# Patient Record
Sex: Female | Born: 2000 | Race: Black or African American | Hispanic: No | Marital: Single | Smoking: Never smoker
Health system: Southern US, Community
[De-identification: ages and names within clinical notes are randomized; demographics above are authoritative.]

## PROBLEM LIST (undated history)

## (undated) DIAGNOSIS — F840 Autistic disorder: Secondary | ICD-10-CM

---

## 2019-02-09 ENCOUNTER — Emergency Department (HOSPITAL_COMMUNITY)
Admission: EM | Admit: 2019-02-09 | Discharge: 2019-03-29 | Disposition: A | Discharge status: Other Acute Inpatient Hospital | Payer: No Typology Code available for payment source | Attending: Emergency Medicine | Admitting: Emergency Medicine

## 2019-02-09 ENCOUNTER — Other Ambulatory Visit: Payer: Self-pay

## 2019-02-09 ENCOUNTER — Encounter (HOSPITAL_COMMUNITY): Payer: Self-pay | Admitting: Emergency Medicine

## 2019-02-09 DIAGNOSIS — F840 Autistic disorder: Secondary | ICD-10-CM

## 2019-02-09 DIAGNOSIS — R479 Unspecified speech disturbances: Secondary | ICD-10-CM | POA: Diagnosis not present

## 2019-02-09 DIAGNOSIS — Z79899 Other long term (current) drug therapy: Secondary | ICD-10-CM | POA: Insufficient documentation

## 2019-02-09 DIAGNOSIS — R4689 Other symptoms and signs involving appearance and behavior: Secondary | ICD-10-CM

## 2019-02-09 DIAGNOSIS — Z20828 Contact with and (suspected) exposure to other viral communicable diseases: Secondary | ICD-10-CM | POA: Diagnosis not present

## 2019-02-09 DIAGNOSIS — R456 Violent behavior: Secondary | ICD-10-CM | POA: Diagnosis not present

## 2019-02-09 HISTORY — DX: Autistic disorder: F84.0

## 2019-02-09 LAB — CBC WITH DIFFERENTIAL/PLATELET
Abs Immature Granulocytes: 0.03 10*3/uL (ref 0.00–0.07)
Basophils Absolute: 0 10*3/uL (ref 0.0–0.1)
Basophils Relative: 0 %
Eosinophils Absolute: 0 10*3/uL (ref 0.0–0.5)
Eosinophils Relative: 0 %
HCT: 39.2 % (ref 36.0–46.0)
Hemoglobin: 13.1 g/dL (ref 12.0–15.0)
Immature Granulocytes: 0 %
Lymphocytes Relative: 18 %
Lymphs Abs: 1.6 10*3/uL (ref 0.7–4.0)
MCH: 31.3 pg (ref 26.0–34.0)
MCHC: 33.4 g/dL (ref 30.0–36.0)
MCV: 93.8 fL (ref 80.0–100.0)
Monocytes Absolute: 0.5 10*3/uL (ref 0.1–1.0)
Monocytes Relative: 6 %
Neutro Abs: 6.9 10*3/uL (ref 1.7–7.7)
Neutrophils Relative %: 76 %
Platelets: 287 10*3/uL (ref 150–400)
RBC: 4.18 MIL/uL (ref 3.87–5.11)
RDW: 12.1 % (ref 11.5–15.5)
WBC: 9.1 10*3/uL (ref 4.0–10.5)
nRBC: 0 % (ref 0.0–0.2)

## 2019-02-09 LAB — COMPREHENSIVE METABOLIC PANEL
ALT: 25 U/L (ref 0–44)
AST: 47 U/L — ABNORMAL HIGH (ref 15–41)
Albumin: 3.9 g/dL (ref 3.5–5.0)
Alkaline Phosphatase: 79 U/L (ref 38–126)
Anion gap: 8 (ref 5–15)
BUN: 5 mg/dL — ABNORMAL LOW (ref 6–20)
CO2: 22 mmol/L (ref 22–32)
Calcium: 9 mg/dL (ref 8.9–10.3)
Chloride: 105 mmol/L (ref 98–111)
Creatinine, Ser: 0.62 mg/dL (ref 0.44–1.00)
GFR calc Af Amer: >60 mL/min (ref >60)
GFR calc non Af Amer: >60 mL/min (ref >60)
Glucose, Bld: 110 mg/dL — ABNORMAL HIGH (ref 70–99)
Potassium: 3.6 mmol/L (ref 3.5–5.1)
Sodium: 135 mmol/L (ref 135–145)
Total Bilirubin: 0.5 mg/dL (ref 0.3–1.2)
Total Protein: 7.5 g/dL (ref 6.5–8.1)

## 2019-02-09 LAB — ACETAMINOPHEN LEVEL: Acetaminophen (Tylenol), Serum: <10 ug/mL — ABNORMAL LOW (ref 10–30)

## 2019-02-09 LAB — SALICYLATE LEVEL: Salicylate Lvl: <7 mg/dL (ref 2.8–30.0)

## 2019-02-09 MED ORDER — LORAZEPAM 1 MG PO TABS
1.0000 mg | ORAL_TABLET | Freq: Four times a day (QID) | ORAL | Status: DC
  Administered 2019-02-09 – 2019-02-11 (×4): 1 mg via ORAL
  Filled 2019-02-09 (×4): qty 1

## 2019-02-09 MED ORDER — ARIPIPRAZOLE 5 MG PO TABS
15.0000 mg | ORAL_TABLET | Freq: Every day | ORAL | Status: DC
  Administered 2019-02-09: 15 mg via ORAL
  Filled 2019-02-09: qty 3

## 2019-02-09 MED ORDER — HALOPERIDOL 5 MG PO TABS
5.0000 mg | ORAL_TABLET | ORAL | Status: DC | PRN
  Administered 2019-02-10 – 2019-03-07 (×9): 5 mg via ORAL
  Filled 2019-02-09 (×9): qty 1

## 2019-02-09 MED ORDER — BUSPIRONE HCL 5 MG PO TABS
10.0000 mg | ORAL_TABLET | Freq: Two times a day (BID) | ORAL | Status: DC
  Administered 2019-02-09 – 2019-03-29 (×96): 10 mg via ORAL
  Filled 2019-02-09 (×94): qty 2

## 2019-02-09 MED ORDER — TRIPLE ANTIBIOTIC 3.5-400-5000 EX OINT: 1.0000 "application " | TOPICAL_OINTMENT | Freq: Every day | CUTANEOUS | Status: DC | PRN

## 2019-02-09 MED ORDER — ACETAMINOPHEN 325 MG PO TABS
650.0000 mg | ORAL_TABLET | ORAL | Status: DC | PRN
  Administered 2019-03-20: 14:00:00 650 mg via ORAL
  Filled 2019-02-09: qty 2

## 2019-02-09 MED ORDER — CARBAMAZEPINE 200 MG PO TABS
600.0000 mg | ORAL_TABLET | Freq: Two times a day (BID) | ORAL | Status: DC
  Administered 2019-02-09 – 2019-03-29 (×95): 600 mg via ORAL
  Filled 2019-02-09 (×94): qty 3

## 2019-02-09 MED ORDER — DOCUSATE SODIUM 100 MG PO CAPS: 100.0000 mg | ORAL_CAPSULE | Freq: Two times a day (BID) | ORAL | Status: DC | PRN

## 2019-02-09 MED ORDER — PRAZOSIN HCL 1 MG PO CAPS
2.0000 mg | ORAL_CAPSULE | Freq: Every day | ORAL | Status: DC
  Administered 2019-02-10 – 2019-03-28 (×46): 2 mg via ORAL
  Filled 2019-02-09 (×42): qty 2
  Filled 2019-02-09: qty 1
  Filled 2019-02-09 (×6): qty 2

## 2019-02-09 MED ORDER — ALUM & MAG HYDROXIDE-SIMETH 200-200-20 MG/5ML PO SUSP: 20.0000 mL | Freq: Four times a day (QID) | ORAL | Status: DC | PRN

## 2019-02-09 MED ORDER — CLONIDINE HCL 0.1 MG PO TABS
0.1000 mg | ORAL_TABLET | Freq: Two times a day (BID) | ORAL | Status: DC
  Administered 2019-02-09 – 2019-03-29 (×96): 0.1 mg via ORAL
  Filled 2019-02-09 (×95): qty 1

## 2019-02-09 MED ORDER — POLYETHYLENE GLYCOL 3350 17 G PO PACK: 17.0000 g | PACK | Freq: Every day | ORAL | Status: DC | PRN

## 2019-02-09 MED ORDER — LEVONORGEST-ETH ESTRAD 91-DAY 0.15-0.03 &0.01 MG PO TABS: 1.0000 | ORAL_TABLET | Freq: Every morning | ORAL | Status: DC

## 2019-02-09 MED ORDER — MELATONIN 3 MG PO TABS
6.0000 mg | ORAL_TABLET | Freq: Every day | ORAL | Status: DC
  Administered 2019-02-09 – 2019-03-02 (×22): 6 mg via ORAL
  Administered 2019-03-03 (×2): 3 mg via ORAL
  Administered 2019-03-04 – 2019-03-28 (×25): 6 mg via ORAL
  Filled 2019-02-09 (×48): qty 2

## 2019-02-09 MED ORDER — LINACLOTIDE 145 MCG PO CAPS
145.0000 ug | ORAL_CAPSULE | Freq: Every morning | ORAL | Status: DC
  Administered 2019-02-10 – 2019-03-28 (×45): 145 ug via ORAL
  Filled 2019-02-09 (×53): qty 1

## 2019-02-09 MED ORDER — LORAZEPAM 2 MG/ML IJ SOLN
2.0000 mg | Freq: Once | INTRAMUSCULAR | Status: AC
  Administered 2019-02-09: 2 mg via INTRAVENOUS
  Filled 2019-02-09: qty 1

## 2019-02-09 NOTE — ED Notes (Signed)
Soft restraints placed on patient's ankles.

## 2019-02-09 NOTE — ED Notes (Signed)
Patient given graham crackers and soda to drink.
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
## 2019-02-09 NOTE — ED Notes (Signed)
Patient placed in purple scrubs and given a baby doll.
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
## 2019-02-09 NOTE — ED Notes (Addendum)
Physician notified about emergency IVC paper work.
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
## 2019-02-09 NOTE — ED Notes (Signed)
Patient incontinent. Brief being placed on patient along with change of scrubs and linen change.
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
## 2019-02-10 MED ORDER — ZIPRASIDONE MESYLATE 20 MG IM SOLR
10.0000 mg | Freq: Once | INTRAMUSCULAR | Status: AC
  Administered 2019-02-10: 10 mg via INTRAMUSCULAR
  Filled 2019-02-10: qty 20

## 2019-02-10 MED ORDER — TRAZODONE HCL 50 MG PO TABS
50.0000 mg | ORAL_TABLET | Freq: Every day | ORAL | Status: DC
  Administered 2019-02-10 – 2019-03-28 (×47): 50 mg via ORAL
  Filled 2019-02-10 (×48): qty 1

## 2019-02-10 MED ORDER — ZIPRASIDONE MESYLATE 20 MG IM SOLR
10.0000 mg | Freq: Once | INTRAMUSCULAR | Status: AC
  Administered 2019-02-12: 10 mg via INTRAMUSCULAR
  Filled 2019-02-10: qty 20

## 2019-02-10 MED ORDER — ARIPIPRAZOLE 5 MG PO TABS
10.0000 mg | ORAL_TABLET | Freq: Two times a day (BID) | ORAL | Status: DC
  Administered 2019-02-10 – 2019-03-29 (×95): 10 mg via ORAL
  Filled 2019-02-10 (×92): qty 2

## 2019-02-10 MED ORDER — LORAZEPAM 2 MG/ML IJ SOLN: 1.0000 mg | Freq: Once | INTRAMUSCULAR | Status: DC

## 2019-02-10 NOTE — Consult Note (Signed)
  Consult request placed for medication recommendations. Consider changing Abilify to 10 BID and adding Trazodone 50mg  QHS.
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
## 2019-02-10 NOTE — Progress Notes (Signed)
CSW spoke with Meagan Rivera in admissions at [facility] [phone]). She states that she has been notified by pt's Care Coordinator Meagan Rivera) that pt is now at [facility] ED. She verified that Meagan Rivera does have a referral for pt and that pt's referral application will be reviewing by their admissions' team on Oct. 20th. Meagan Rivera also shared that if pt is, in fact, accepted to [facility] Rivera on the 20th, that there is often a waiting period prior to pt being admitted. She suggested communicating with pt's Meagan Rivera, as he is reportedly quite familiar with [facility] Rivera's screening process.   CSW will collaborate with [facility] ED social worker [employer] about case.   Audree Camel, LCSW, [place] Disposition [facility]/TTS [phone]

## 2019-02-10 NOTE — ED Provider Notes (Signed)
Call from TTS. Recommendation from Dr. Dwyane Dee: Change Abilify  to 10 mg BID Trazodone 50 mg q HS Try to avoid Ativan which may disinhibit her. Trying to place at [facility]. There is a waiting period. The time period is unknown.         Daleen Bo, MD 02/10/19 1550

## 2019-02-11 ENCOUNTER — Emergency Department (HOSPITAL_COMMUNITY): Payer: No Typology Code available for payment source

## 2019-02-11 LAB — I-STAT BETA HCG BLOOD, ED (MC, WL, AP ONLY): I-stat hCG, quantitative: <5 m[IU]/mL (ref <5)

## 2019-02-11 MED ORDER — HYDROXYZINE HCL 50 MG/ML IM SOLN: 25.0000 mg | Freq: Four times a day (QID) | INTRAMUSCULAR | Status: DC | PRN
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
## 2019-02-11 NOTE — ED Notes (Signed)
Writer, [place] NT, read the notes of the pt., and felt as if the pt. Needed to be a feeder. Asked RN if that was fine, they agreed. Fed pt., while having pt. In a 50 degree sitting position. Pt. Took small bites of breakfast. Was calm and cooperative. However, pt. Did make a reaction when eating the eggs. She stopped chewing the hard boiled eggs and coughed them back out. Writer decided to feed her everything else on the tray instead of the eggs. Did fine with everything else. Will continue to monitor.

## 2019-02-11 NOTE — Clinical Social Work Note (Signed)
Spoke with Dr Sabra Heck this AM to outline options to pursue today.  After talking with Dr Dwyane Dee, he suggested we explore option of getting patient back into "[facility]."  After further investigation, figured out [facility] is the same as [facility], the same hospital she was at prior to recent placement.  She was being held in the ED there, same status as here.  Although Dr Ammie Ferrier note states she is inappropriate for [facility] due to being non-verbal, her care manager stated they have had client's with the same diagnosis and much the same behaviors who have been admitted to [facility], that the [facility] treatment team is currently recommending referral to [facility] as [facility] option is currently unknown. With that in mind, I contacted Sarah with TTS, who agreed to send referral to [facility].   Ms Owens Shark clarified that the meeting with [facility] is 10/13 at 2:30 and they are needing input from the hospital, so I gave her Jessica's information. She will be sent link for [employer] meeting via the web.

## 2019-02-11 NOTE — BH Assessment (Signed)
Initiating referral to [facility].   Completed [place] referral form. Completed Diversion form.   Contacted [place] to obtain authorizations for [facility]/Diversion. Counselor was informed that patient does not have [facility].   [facility] registration ran [employer] ran patient through [employer] and found that patient has [facility] with [employer] called [facility] and was informed that patient is not in their system. They tried looking patient up using name, ss#, DOB, etc.   Discussed with [facility] registration and they are investigating patient's Medicaid to find out what county her Medicaid [place] registered.   Registration found that patient's Medicaid is based in [facility] or [facility]. AM Social Worker to follow up with [employer] to request authorization [phone] and ax completed packet to [place].
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
## 2019-02-11 NOTE — BH Assessment (Signed)
Reassessment Note: Pt presents sitting up in bed with restraints to wrists. She looks into telepsych monitor, when I am speaking to her. [name] speech/sounds are difficult to understand, like a drawn out one-syllable word. When asked if she was watching TV, pt said "yeeee" & looked up to television. Pt frequently pulls herself from a lying to sitting position. She smiled intermittently throughout assessment. Pt was in constant motion & looking around the room.

## 2019-02-11 NOTE — ED Provider Notes (Signed)
The pt has had intermittent outbursts - all of the notes have been reviewed including pyschiatric assessment and recommendations - I have personally discussed the case with Roque Lias and Dr. Dwyane Dee - as well as [facility] with care coordination.    At this time the patient does NOT meet critieria for [facility] due to being non verbal, she has been at [facility] / care center in the past and has evidently graduated to a lower level of care -and unfortunately has failed this level of care.  If the patient was to be admitted to the hospital this may be considered increased resources and must place her much lower on the list for a need for transition back to [facility].    At this time the patient's mother and the care coordinator who is working with the mother on getting the patient placed back into [facility] will meet with them on October 14 to discuss the potential for the patient to go back to that facility.  If they agree that that is the right thing to do the patient will be placed on a list to be next available he transferred to that area, if that is not the case we will need to look at other options of which there are very few.  This patient will need daily psychiatric consultation for adjustment of medications, we will attempt to place the patient in her room where there is little stimulation, the patient will need a sitter for safety reasons, doing her best to provide hygiene, food and a tranquil environment at this time.  Dr. Steffanie Rainwater, Aaron Edelman, MD 02/11/19 1019
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
## 2019-02-12 MED ORDER — LORAZEPAM 1 MG PO TABS
1.0000 mg | ORAL_TABLET | Freq: Once | ORAL | Status: AC
  Administered 2019-02-12: 1 mg via ORAL

## 2019-02-12 MED ORDER — HYDROXYZINE HCL 50 MG/ML IM SOLN
50.0000 mg | Freq: Four times a day (QID) | INTRAMUSCULAR | Status: DC | PRN
  Administered 2019-02-13 – 2019-02-20 (×2): 50 mg via INTRAMUSCULAR
  Filled 2019-02-12 (×2): qty 1

## 2019-02-12 MED ORDER — LORAZEPAM 0.5 MG PO TABS
0.5000 mg | ORAL_TABLET | Freq: Two times a day (BID) | ORAL | Status: DC
  Administered 2019-02-12 (×2): 0.5 mg via ORAL
  Filled 2019-02-12 (×3): qty 1

## 2019-02-12 MED ORDER — LORAZEPAM 2 MG/ML IJ SOLN: 1.0000 mg | Freq: Once | INTRAMUSCULAR | Status: AC

## 2019-02-12 MED ORDER — STERILE WATER FOR INJECTION IJ SOLN
INTRAMUSCULAR | Status: AC
  Filled 2019-02-12: qty 10

## 2019-02-12 NOTE — ED Notes (Addendum)
Tech feeding patient dinner. Officer at the doorway

## 2019-02-12 NOTE — ED Notes (Signed)
Patient continues to set up in bed, sheet covering patient, staff waiting for patient to relax before attempting to put on new scrubs.

## 2019-02-12 NOTE — ED Notes (Signed)
Patient continues to be very hyper, sitting up in bed, loudly yelling intermittently. Sitter at the door, Garment/textile technologist in the hallway.
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
## 2019-02-12 NOTE — ED Notes (Signed)
Patient continuing to escalate pulling, sliding to the bottom of the bed, biting, reaching and throwing herself toward staff. Patient has pulled scrubs off.  Staff can not control to dress patient.   EDP advised to call [facility] they need to adjust medication.  New order being placed and they will review.
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
## 2019-02-12 NOTE — ED Notes (Signed)
[facility] NP called to add Ativan Bid to orders to see if this will help.

## 2019-02-12 NOTE — ED Notes (Signed)
Pt started menstrual cycle
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
## 2019-02-12 NOTE — ED Notes (Signed)
[facility] is working on getting patient back at  [facility], They may not have an opening for 2 weeks.
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
## 2019-02-12 NOTE — Consult Note (Signed)
Case staffed with MD Dwyane Dee for medication recommendation. Will discontinue ativan 0.5mg  bid and titrated Vistaril 25 mg to 50 mg daily. Stated patient's medications was recently adjusted on 10/09.

## 2019-02-12 NOTE — ED Notes (Signed)
Sitter feeding patient, eating well.
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
## 2019-02-12 NOTE — Progress Notes (Signed)
CSW contacted [facility] to check status of review in attempt to obtain authorization number. It has been discovered that pt's Medicaid is out of [facility]. CSW contact [place] [phone] to confirm pt's Medicaid information. [facility] representative requested that [facility] referral and Diversion Request be faxed to: [phone] for review. CSW faxed necessary documents per request.  [facility]. Ou[place] Sills, MSW, LCSW Clinical Social Worker 02/12/2019 4:34 PM

## 2019-02-12 NOTE — ED Notes (Signed)
Pt taking medication without difficulty, drinking a cup of water, pt non verbal moaning loudly, sitting up rocking side to side in bed, officer and sitter at the bedside, no redness to arms arne legs.
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
## 2019-02-12 NOTE — ED Notes (Signed)
Only eating 25% of lunch, resting has been calm since  New order of  Ativan given at 10 am

## 2019-02-12 NOTE — Progress Notes (Signed)
CSW received a call from [place] at [facility]. Anderson Malta requested that [facility] paperwork also reflect pt's behaviors while in the ED in order for the [facility] to assign an authorization number--CSW updated this information and re-faxed to fax number provided by Anderson Malta: [phone].  Galina Haddox S. Ouida Sills, MSW, LCSW Clinical Social Worker 02/12/2019 9:05 AM
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
## 2019-02-13 NOTE — ED Notes (Signed)
Pt sat up in bed and pulling out hair. Pt restraints adjusted and mits applied.

## 2019-02-13 NOTE — ED Notes (Signed)
Pt has wet bed, 2 NT at bedside and officer present.

## 2019-02-14 NOTE — ED Notes (Signed)
Attempted to trial release pt from restraints and again tried to grab my shirt. Restraints not removed.

## 2019-02-14 NOTE — ED Notes (Signed)
Pt has been given multiple cups of water throughout the day. Loves to drink water and will drink a whole cup at one time.

## 2019-02-14 NOTE — ED Notes (Signed)
Spoke with Ava who is Education officer, museum with [facility] and updated her on pt's case as she is just coming in. Explained difficulty we are having with placement and recommended seeing what results are from meeting tomorrow regarding [facility] as [facility] placement will be lengthy. She is going to have the NP call me to discuss case.

## 2019-02-14 NOTE — ED Notes (Signed)
Spoke with pt's mother who is aware of meeting with [facility] tomorrow at 92 and she will be participating.
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
## 2019-02-14 NOTE — ED Notes (Signed)
Pt fed meal tray, and water.

## 2019-02-14 NOTE — ED Notes (Signed)
Pt rocking back and forth and appears agitated. Ate her lunch with assistance from sitter.

## 2019-02-14 NOTE — ED Provider Notes (Signed)
Patient currently restrained for safety/safe delivery of care.   Pt with periods agitated/aggressive behavior, currently calm and alert, and appears in no acute distress.  SW/CM is working on placement, possible back to her group home, vs new group home, vs [facility].      Lajean Saver, MD 02/14/19 [phone]

## 2019-02-14 NOTE — Progress Notes (Signed)
Patient ID: Meagan Rivera, female   DOB: 06-Oct-2000, 18 y.o.   MRN: [id number]   Reassessment  We discussed patients case this morning in bed meeting. Per Dr. Dwyane Dee, patient continues to meet inpatient criteria. Per LCSW, referral has been completed for [facility]. There was a medication consultation made by Dr. Sabra Heck from the ED. Medication adjustments were made on 02/12/2019 by Ricky Ala, NP here at [facility]. I spoke with patients nurse Leafy Ro, who provided an updated on patients behaviors. As per [facility] patients behaviors have been reproted as normal  By her mother. She reports that patients pulling on staff is a way to express herself and not trying to harm others. She reports this was too reported by patients mother. Reports that she has noticed that when patient is left alone in her room, patient is very calm and cooperative. Reports patient has been trying to communicate through sign language. Reports that mother reported that patient likes to pull hair and this is more of a sensory perception. Reports that mother reported that patient has a doll at home which she use to pull her hair Reports they have been trying to find a doll at the hospital for patient. Reports that mother state that patient does not like people in her personal space. Reports that mother reported when people are in her personal space, she may hit them however, she will remove herself from the situation. As per nurse Leafy Ro, mother has reported all these behaviors as normal. There does not appear to be a need to adjust medications at this time.     Patient does have a meeting tomorrow regarding [facility] at 2:30. Updates should be provided following the meeting.

## 2019-02-14 NOTE — ED Notes (Signed)
Brief checked and is dry.

## 2019-02-14 NOTE — ED Provider Notes (Signed)
Specific request made for psychiatry to manage medicines today as well as to very specifically comment on ability of this patient to qualify for [facility] placement - would like explore all options.   Noemi Chapel, MD 02/14/19 [phone]

## 2019-02-14 NOTE — ED Notes (Signed)
[name] restraints removed for trial. Took medications without difficulty. Communicated with sign as review of pt's papers show pt knows simple sign language. Is listening to music in room and clapping.
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
## 2019-02-14 NOTE — ED Notes (Signed)
Spoke with [facility] regarding pt's potential placement and treatment plan. They will contact [facility] and information for this given.

## 2019-02-14 NOTE — ED Notes (Signed)
Visible chest rise and fall noted. Pt resting comfortably

## 2019-02-15 MED ORDER — POLYETHYLENE GLYCOL 3350 17 G PO PACK: 17.0000 g | PACK | Freq: Every day | ORAL | Status: DC | PRN

## 2019-02-15 NOTE — Clinical Social Work Note (Signed)
LCSW was present on a one hour phone call with multiple other agencies related to pt's placement needs. Information was collected by appropriate agencies and determination of pt's eligibility for [facility] will not be determined until 02/22/19. Will follow up with pt's LME tomorrow to inquire if there is a Crisis placement pt can go to in the meantime.  Will follow.

## 2019-02-15 NOTE — BH Assessment (Signed)
[employer] Assessment Progress Note   Patient was seen this date for re-assessment.  She is smiling and appears to be happy at this moment.  She continues to remain in restraints and is very animated in the bed rocking back and forth. Due to her inability to speak, she was not able to answer questions except with one word.  When asked if she was feeling happy, she said yes.  She did not appear to have the ability to answer anymore questions.  TTS will continue to seek placement for patient.

## 2019-02-16 NOTE — ED Notes (Addendum)
[employer] signed off at this time.  

## 2019-02-16 NOTE — ED Notes (Signed)
mepilex dressing placed on elbows due to skin breakdown
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
## 2019-02-16 NOTE — Clinical Social Work Note (Signed)
Spoke with Judieth Keens at [facility] regarding pt's current status. Patient will be officially reviewed by [facility] on 02/22/19. Decision will be communicated to [facility] on 02/23/19.   Spoke with Audree Camel at [facility] regarding referral to [facility] in the meantime. TTS working on this and on getting pt on the priority waiting list there. Charise at [facility] also working on alternative options for treatment until [facility] decision is made.  Will follow and assist as needed.
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
## 2019-02-16 NOTE — ED Notes (Signed)
Pt wet,cleaned up and changed

## 2019-02-16 NOTE — ED Provider Notes (Addendum)
I personally evaluated the patient this morning on February 16, 2019,  The patient is calm, she seems to be happy, she is interactive with staff, she is nonverbal, she does not seem to be agitated, she has not been aggressive overnight and has been nonviolent through the last shift.  Psychiatric consultation has been made, notes were reviewed including [facility] as well as ongoing work with [facility], hopefully recommendations will be made soon as this patient in the emergency department is offering suboptimal care.  Psychiatry reconsulted this morning for medication management and repeat evaluation  Physical therapy has seen the patient and assisted with ambulation, they report that she is very strong, she can walk very well, it was noted that after coming out of the room and walking down the hallway the patient looked around, was becoming agitated and started to bite and scratch.  She was placed back into her room into soft restraints, she otherwise does not appear to be in any distress.   Noemi Chapel, MD 02/16/19 [phone]    Noemi Chapel, MD 02/16/19 1025

## 2019-02-16 NOTE — Progress Notes (Addendum)
Patient ID: Meagan Rivera, female   DOB: January 12, 2001, 18 y.o.   MRN: [id number]   We discussed patients case at [facility] during treatment team meeting this morning. We are continuing to recommend inpatient psychiatric admission.  We were advised that patient had a meeting with [facility] at 2:30 p.m. yesterday. We have not received any updates in regard to the outcome of this meeting. [facility] and placed on the priority waiting list. There has been no updates as of yet however, LCSW will follow-up. There has been medications adjustments made on10/02/2019  as recommended by [facility] NP and MD. There does not appear to be a need to adjust medications at this time.  We will continue to work towards placement and update ED once placement has been found.

## 2019-02-17 ENCOUNTER — Other Ambulatory Visit: Payer: Self-pay

## 2019-02-17 NOTE — BH Assessment (Addendum)
Followed up with [facility] referral/diversion/priority wait list status.  Contacted [facility] and she stated that [facility]'s acceptance to the wait list was pending a signature page with authorization from the [facility] ([facility]). Faxed document to [facility]. Confirmed receipt of document with Roumany. He stated that now all paperwork has been received it will go to management because of the request for a diversion and priority wait list status.  Roumay pointed out that patient's IVC is expiring. He reminded me that an updated IVC would need to be faxed at some point. Counselor contacted [place] and was informed that the IVC was completed this morning and awaiting to be served. States that their is not ETA of the paperwork to be served to be patient. However, a copy will be faxed to [facility]. Once that copy is received via fax oncoming staff will need to fax to [facility] for their records.  Roumany states that every time a petition is redone/reserved typically a new diversion/[facility] referral form needs to be completed as well. However, he suggested to wait until her bed becomes available so that the forms don't have to be completed repeatedly (every 7 days) as she remains in the Emergency Room. Therefore, forms will need to be completed when her bed becomes available. [facility] was not able to give any indication of how long the waitlist will be other than stating, "I't pretty long and may take some time for her to get accepted".  [facility] also requested updated nursing notes but warned me that she will probably not be accepted to their priority list because of her cooperative behaviors over the last 1-2 days. States that priority wait listed patients are only considered if their behaviors are typically within the last 24 hours. He will have his supervisor review all the notes and records to make a final decision regarding the priority list versus regular wait list.  AM social worker will need to follow up with patient's acceptance  on the waitlist as a decision should be made per [facility] tomorrow morning. Marland Kitchen

## 2019-02-17 NOTE — ED Provider Notes (Signed)
The patient was seen and evaluated on the morning of February 17, 2019 by myself.  She has been calm and cooperative overnight, vital signs are normal for age and condition.  She has been without outbursts overnight, able to sleep intermittently, taking food when offered.  Per psychiatric notes the patient has no more need for adjustment of medications, she still requires inpatient treatment and has [facility], will not hear about [facility] until October 21 most likely, [facility] hopefully on the expedited priority list.  No other acute conditions requiring intervention at this time.  Continue psychiatric therapy and continuing to keep the patient safe.  She was able to ambulate with physical therapy yesterday.   Noemi Chapel, MD 02/17/19 [phone]

## 2019-02-17 NOTE — ED Notes (Signed)
[name] Lt wrist untied from restraints, pt fed herself.

## 2019-02-17 NOTE — ED Notes (Signed)
Gave pt chocolate pudding for a snack

## 2019-02-17 NOTE — ED Notes (Signed)
Change pt bed and diaper

## 2019-02-17 NOTE — ED Notes (Signed)
Pt placed on hospital bed

## 2019-02-17 NOTE — ED Notes (Signed)
Gave pt a cup of drink
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
## 2019-02-17 NOTE — Clinical Social Work Note (Signed)
LCSW following. Spoke with TTS Audree Camel this AM regarding pt. Per Judson Roch, she is finalizing the referral to [facility]. LCSW also left message for Ms. Brown from pt's LME to determine if she found any alternative options.  Will follow.

## 2019-02-17 NOTE — ED Notes (Signed)
Restraint to RT wrist untied for 10 min, pt moving arm around picking at sheets

## 2019-02-17 NOTE — ED Notes (Addendum)
Restraint to LT wrist untied for 10 min , pt moving arm around dancing back and forth

## 2019-02-17 NOTE — ED Notes (Signed)
Pt bed changed pt change grown

## 2019-02-17 NOTE — ED Notes (Signed)
IVC Paperwork served and faxed to [facility]

## 2019-02-18 NOTE — Clinical Social Work Note (Signed)
Spoke with Judson Roch from TTS. Judson Roch confirms that pt is on the waiting list at [facility] and TTS faxing [place] daily especially regarding pt's restraint status as that will keep pt on the priority list.   [place] asking for pt's current IVC paperwork to be faxed to [phone]. Will ask ED Secretary to assist with this request.

## 2019-02-18 NOTE — Care Management (Signed)
Per [place], the patient information is being reviewed regarding placement to the priority wait list.

## 2019-02-18 NOTE — ED Notes (Signed)
Patient has sitter at this time. Patient is having no issues with behavior at this time.

## 2019-02-18 NOTE — ED Notes (Signed)
Small, circular abrasions noted to bilateral wrists near soft wrist restraint sites. Abrasions appear to be healing and not new. Dry wound base with scabbing present. Discussed with assistant nursing director who reports she believes pt did have these abrasions present upon arrival to ED.

## 2019-02-18 NOTE — BH Assessment (Signed)
[place] Assessment Progress Note    TTS spoke to patient's nurse, Jinny Blossom.  She states that patient continues to be non-verbal for the most part.  She states that patient did say the word eat and they got her something and she ate.  She states that they took the restraints off the patient has night and she stood up on the bed and was dancing and jumping around.  She states that patient was not aggressive, but they had to put her back in soft restraints in order to keep her from possibly hurting herself. Due to patient's severe cognitive delay and erratic behavior, inpatient treatment continues to be recommended.

## 2019-02-18 NOTE — ED Notes (Signed)
Changed pt diaper and bed sheets. Placed warm blanket on pt. D/C left arm non violent restraint and placed right arm in non violent restraint.

## 2019-02-18 NOTE — ED Notes (Addendum)
Pt climing/standing on bed, has taken sheets off of the bed , removed right restraubt from wrist and has taken her brief off. Sheets changed and brief placed on patient. Pt calm and following instructions at this time. Both non-violent wrist restraints placed on pt bilaterally.
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
## 2019-02-18 NOTE — ED Notes (Signed)
Pt had a large loose bowel movement on self. Cleaned pt, changed sheets. PT clean and dry at this time. Pt able to freely move around her bed.

## 2019-02-19 NOTE — ED Notes (Signed)
Change pt diaper

## 2019-02-19 NOTE — ED Notes (Signed)
Patient is awake at this time. Patient has sitter at bedside. Patient checked and brief changed at this time. Patient says eat eat, patient offered applesauce at this time. Refused to eat it.

## 2019-02-19 NOTE — ED Notes (Signed)
Pt sitting up in bed and dancing.

## 2019-02-19 NOTE — ED Notes (Signed)
[facility] reassessing pt at this time
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
## 2019-02-19 NOTE — BH Assessment (Signed)
[place] Assessment Progress Note    Patient was seen for reassessment this date.  Based on report, she had a good night last  Night and was out of her restraints the majority of the night.  She presents with a much brighter affect today and she is calm and can follow directions  Patient continues to remain non-verbal, but appeared to understand what was being said to her. Due to her unpredictable and volatile behavior, continued inpatient is recommended.

## 2019-02-19 NOTE — ED Notes (Signed)
Starting at [place] patient has been unrestrained. Pt has been sleeping since 0300 with no problems.

## 2019-02-19 NOTE — ED Notes (Signed)
Pt sitting in bed. Pt calm at this time. Making sounds to staff.

## 2019-02-20 DIAGNOSIS — F840 Autistic disorder: Secondary | ICD-10-CM

## 2019-02-20 MED ORDER — LORAZEPAM 2 MG/ML IJ SOLN: 0.5000 mg | INTRAMUSCULAR | Status: DC | PRN

## 2019-02-20 MED ORDER — LORAZEPAM 0.5 MG PO TABS
0.5000 mg | ORAL_TABLET | ORAL | Status: DC | PRN
  Administered 2019-02-23 – 2019-03-20 (×7): 0.5 mg via ORAL
  Filled 2019-02-20 (×8): qty 1

## 2019-02-20 NOTE — ED Notes (Signed)
Notified MD about pt being psych cleared

## 2019-02-20 NOTE — ED Notes (Signed)
Legrand Como, NT assisting with feeing pt - meal tray given

## 2019-02-20 NOTE — ED Notes (Signed)
Pt given cheetos, cup of water, and applesauce- pt assisted with eating by Legrand Como, NT, sitter- pt consumed 100%.  After pt finished eating, pt continues making noises that are incomprehensible; pt in no distress, pt does not appear in pain.  When asked it pt is still hungry, pt says, "eat, eat" Will heat up dinner tray to give pt.

## 2019-02-20 NOTE — ED Provider Notes (Signed)
Behavior office given some medication modifications.  They are thinking in terms of [facility].  Patient still acting very bizarrely.  Still some concerns for violent behavior.  We will renew the restraints.  Patient seen by me 5 minutes ago.   Fredia Sorrow, MD 02/20/19 2007

## 2019-02-20 NOTE — ED Notes (Signed)
Barnett Applebaum, RN, [facility] made aware that pt dose of minipress is not in pyxis under meds for holding pt where it is supposed to be. Barnett Applebaum will bring from [place]

## 2019-02-20 NOTE — ED Notes (Signed)
Pt. Sleeping on her side. Staff is able to seeing that the Pt. Is breathing and that she is comfortable.
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
## 2019-02-20 NOTE — ED Provider Notes (Signed)
This patient has been calm overnight, she remains out of restraints, she has taken medications as appropriate, slept intermittently, per behavioral health notes from yesterday morning the patient is continued to be recommended for inpatient placement due to her "unpredictable and volatile" behavior.  No updates on placement opportunities at this time.  Vital signs reviewed without tachycardia hypotension or fevers, tolerating food and fluids without any difficulties   Noemi Chapel, MD 02/20/19 0710
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
## 2019-02-20 NOTE — ED Notes (Signed)
TTS placed at bedside.  

## 2019-02-20 NOTE — ED Notes (Signed)
Pt. Was fed a food tray and ate the entire meal with exception of the carrots. The Pt. After eating a couple of carrots began shaking her head when offered them.
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
## 2019-02-20 NOTE — ED Notes (Signed)
Pt has violent restraint orders and treating pt due to violent behavior; however, pt has non violent restraints on and documenting under non violent restraint, but documenting every 15 minutes due to violent restraint order.

## 2019-02-20 NOTE — ED Notes (Signed)
Pt. Bedding changed when Pt. Brief was changed two staff members present at time of changing the Pt. Pt. Urinated but no stool was noticed by staff at this time.
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
## 2019-02-21 NOTE — Progress Notes (Addendum)
CSW spoke with Nicholes Rough, Admissions Supervisor [phone]) at [facility] who states that after careful review, it has been determined that pt does not meet admission criteria for [place]. "She is not appropriate because her symptoms are due to her I/DD diagnosis, not a mental health condition." Ms Dahlia Client stated that staff from the [place] (Division of [place]) would be contacting CSW today to provide assistance with "speeding up" the acceptance with [facility] and/or placement concerns.  CSW will notify pt's LME ([facility]) of situation.   [facility] Medical Director was also made aware.   Audree Camel, LCSW, [place] Disposition CSW [facility] BHH/TTS [phone]  UPDATE: CSW spoke with [facility] Utilization Management [phone]) staff, who stated that although the [facility] does provide authorization for state hospital placement, it is ultimately up to the hospital on whether to deny or accept a pt. He went on to say that although this can be done, it is "highly unusual".   D.C. suggested that [place] I/DD supervisor, be contacted at [phone]) and informed of situation.

## 2019-02-21 NOTE — Progress Notes (Signed)
CSW received phone call from [facility] @ [facility] ([facility] [employer] She stated that she will be part of pt's admission's meeting with Jack[facility]ul[facility] Denmark tomorrow and is working closely with care coordination and [facility] to assist pt in finding appropriate placement.  Audree Camel, LCSW, [place] Disposition [facility] B[facility]/TTS [phone]

## 2019-02-21 NOTE — ED Notes (Signed)
[name] brief and pads changed.
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
## 2019-02-21 NOTE — ED Notes (Addendum)
Slip knot to bilateral LE released from bed frame, however still remain on pt LE. Pt woke up, looked at nurse, then closed eyes and went back to sleep. Nurse did passive ROM to bilateral LE- normal ROM noted.

## 2019-02-21 NOTE — ED Notes (Signed)
Pt. Resting on her side.
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
## 2019-02-21 NOTE — ED Notes (Signed)
Pt given meal

## 2019-02-22 NOTE — Progress Notes (Signed)
Patient ID: Meagan Rivera, female   DOB: 03/12/2001, 18 y.o.   MRN: [id number]  Reassessment  Patient has been re-assessed by this provider. Patient is noted to be sitting up in bed with sitter and RN at bedside. Patient is calm and cooperative during this evaluation. She remains mostly  averbal although seems to be communicate in other ways with nurse and sitter. Patient remains in soft restraints due to a number of aggressive behaviors and outburst during her hospital course. There seems to be times where patient can remain calm however, her overall level of aggression and outburst, which are well-documented continues to met the need for inpatient psychiatric hospitalization. We as a team, continue to advocate for this plan. With discussion of this case during morning huddle, the plan is to reach back out to [facility] to discuss patients needs and current recommendation and we are hoping to get more information in regard to [facility] as we were informed that there will be another meeting on patients behalf today.

## 2019-02-22 NOTE — Progress Notes (Signed)
CSW spoke with Dr. Louanna Raw, CFO of [facility] and [place] Director, Dr. Dwyane Dee this morning. Dr. Lacinda Axon explained pt's admission denial with [facility] due to, ultimately, concerns regarding her low level of functioning and inability to program. "We do have patients who are mild to moderately disabled adults, but profoundly disabled individuals are not appropriate. [facility] is not a DD center." Dr. Lacinda Axon stated that he has reached out to the Division of [facility] about pt in hopes of "speeding up" the acceptance process with Jackquline Denmark and said that he would do so again this morning. He acknowledged the complexity of pt's situation and shared that he would help in any way he could.   Disposition will continue to follow.   Audree Camel, LCSW, [place] Disposition [facility] BHH/TTS [phone]
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
## 2019-02-23 NOTE — Clinical Social Work Note (Signed)
Received update from [place] at pt's [place], who states that pt has been accepted to [facility] but there is a waiting list for placement there so she cannot dc there at this time.   LME staff working on alternative placement in the meantime. Pt is also on the waiting list at [facility] for interim care before [facility] placement.   CSW will continue to follow.

## 2019-02-23 NOTE — ED Provider Notes (Signed)
I was told at 6:55 AM that patient's restraint orders need to be renewed.  Patient is awake but still laying in bed.  When I talked to her she makes good eye contact.  She seems to shake her head minimally yes that she is feeling okay.  There have been no events during the night.   Rolland Porter, MD 02/23/19 [phone]

## 2019-02-23 NOTE — BHH Counselor (Signed)
Pt remains in the ED due to severe autism and aggressive behavior.  Pt was sitting upright today.  She is largely nonverbal.  At nurse's prompt, she gave a thumbs-up to indicate that she could hear me.  When asked if she was getting enough to eat, Pt rubbed her belly.  Per attending nurse, Pt has been calm today.  She did not appear to be in any restraints.  Given aggression, it is recommended that Pt be placed for inpatient.  Will consult with SW on placement status.

## 2019-02-24 NOTE — BHH Counselor (Signed)
  REASSESSMENT  [place] reevaluated pt for safety and stability.  Pt was observed sitting in the bed while her left arm was restrained.  Pt was playing with her dolls.  Pt responded to [facility] acknowledgment of her dolls by picking up the smaller doll and showing [place].  [facility] prompted and guided pt in counting her dolls.  [facility] verbally counted the dolls with pt while pt held the appropriate number of fingers up.  Pt appeared excited to have human interaction.  Pt continues to be nonverbal.  TTS will continue to look for placement.  Bentleigh Waren L. [place], [place], [facility], [facility] Therapeutic Triage Specialist  [phone]

## 2019-02-24 NOTE — ED Provider Notes (Signed)
Patient is comfortable watching television this morning on assessment.  Patient does make good eye contact during exam.  No acute issues overnight.  Awaiting final placement to help her care.  Golda Acre, MD 02/24/19 [phone]

## 2019-02-24 NOTE — Progress Notes (Signed)
Patient ID: Meagan Rivera, female   DOB: 01/06/2001, 18 y.o.   MRN: [id number]   Patients chart has been reviewed and her case discussed with treatment team this morning here at [facility]. At this time, we are continuing to recommend inpatient psychiatric hospitalization due to severe autism and aggressive behavior. We learned that patient was accepted to [facility] waiting list although getting placed in [place] may take some time. LCSW here at [facility] continues to work on placement and will follow-up with patients LME at  [facility] as they have been working on placement as well. Updates will be provided as received.

## 2019-02-25 NOTE — BH Assessment (Signed)
[place] Assessment Progress Note    Patient was seen for re-assessment.  She presents as being in a pleasant mood having just eaten a meal.  She was smiling, throwing kisses and using the thumbs up.  Attending staff states that she had a good day today and has not had any acting out behaviors.  Patient will remain on the wait list for [facility] until a bed becomes available.

## 2019-02-25 NOTE — BHH Counselor (Signed)
Contacted [facility] to verify and confirm that patient continues to remain on their wait list [phone]. Called x2. No one answered and no option to leave a voicemail.

## 2019-02-25 NOTE — ED Notes (Signed)
Checked to see if pt needed to be changed, pt dry at this time & resting comfortably

## 2019-02-25 NOTE — ED Provider Notes (Signed)
Patient reassessed this morning eating breakfast without difficulty.  No acute issues overnight.  Patient making good eye contact.  Placement pending.  Golda Acre, MD 02/25/19 [phone]

## 2019-02-25 NOTE — ED Notes (Signed)
Changed pt's sheets and brief.

## 2019-02-26 NOTE — ED Provider Notes (Signed)
Updated vitals today are stable.  Patient evaluated yesterday by TTS yesterday, found to be stable, with anticipated discharge to a bed when it becomes available at the [facility].  No issues noted by nursing overnight.   Daleen Bo, MD 02/26/19 [phone]

## 2019-02-26 NOTE — BH Assessment (Signed)
Re-Assessment  Patient was nonverbal, however makes gestures and noises.  Patient's Nurse reports no incidences, since he came on at 0700 and does not think any occurred last night.  He reports Patient is eating meals and sleeps after being given medication.    Per Merlyn Lot, NP;  Patient continues to meets inpatient criteria continue to seek placement.

## 2019-02-26 NOTE — ED Notes (Signed)
Called [facility] for prazosin

## 2019-02-26 NOTE — ED Notes (Signed)
Changed and washed up pt, bed linens changed.

## 2019-02-27 NOTE — ED Notes (Signed)
Lights diminished that pt might rest   Ate entire meal

## 2019-02-27 NOTE — BHH Counselor (Signed)
Re-assessment:   Patient continue to be non-verbal, TTS Probation officer spoke with [employer], RN, for an update. Report patient has bee calm, cooperative and non-combative this morning. Patient has been eating with no issues.   Ricky Ala, NP, patient continue inpatient treatment

## 2019-02-27 NOTE — ED Notes (Signed)
Pt has rested peacefully throughout the night.

## 2019-02-27 NOTE — ED Notes (Signed)
Pt diaper is dry when checked by this RN and rachel, rn  She motions eat and states eat

## 2019-02-27 NOTE — ED Notes (Signed)
Meal provided 

## 2019-02-27 NOTE — ED Notes (Signed)
restraint noted to L wrist  It is release and pt is no longer in restraint  She is non verbal and gives a thumbs up and pats chest when told N name

## 2019-02-27 NOTE — ED Notes (Signed)
Pt reports she is hungry "eat"  Crackers and pizza provided which she is devouring

## 2019-02-27 NOTE — ED Notes (Signed)
Pizza and beverage provided

## 2019-02-27 NOTE — ED Notes (Signed)
Assisted NT to provide peri care to pt. Left elbow noted to have been bleeding recently. Small amount of dried blood seen on sheet. Appears to be an old scabbed area that has lost scabs. Applied pink foam dressing to area.

## 2019-02-27 NOTE — ED Notes (Signed)
Pt continues to board awaiting placement

## 2019-02-27 NOTE — ED Notes (Signed)
[place]apping Lights dimmed

## 2019-02-27 NOTE — ED Notes (Signed)
Lights dimmed for decreased stimuli

## 2019-02-27 NOTE — ED Notes (Signed)
Pt striking out at front of her body   Appears to be escalating in agitated manner

## 2019-02-28 ENCOUNTER — Emergency Department (HOSPITAL_COMMUNITY): Payer: No Typology Code available for payment source

## 2019-02-28 LAB — CBC WITH DIFFERENTIAL/PLATELET
Abs Immature Granulocytes: 0.03 10*3/uL (ref 0.00–0.07)
Basophils Absolute: 0 10*3/uL (ref 0.0–0.1)
Basophils Relative: 0 %
Eosinophils Absolute: 0 10*3/uL (ref 0.0–0.5)
Eosinophils Relative: 0 %
HCT: 43.5 % (ref 36.0–46.0)
Hemoglobin: 14.5 g/dL (ref 12.0–15.0)
Immature Granulocytes: 1 %
Lymphocytes Relative: 25 %
Lymphs Abs: 1.6 10*3/uL (ref 0.7–4.0)
MCH: 31.3 pg (ref 26.0–34.0)
MCHC: 33.3 g/dL (ref 30.0–36.0)
MCV: 93.8 fL (ref 80.0–100.0)
Monocytes Absolute: 0.5 10*3/uL (ref 0.1–1.0)
Monocytes Relative: 7 %
Neutro Abs: 4.3 10*3/uL (ref 1.7–7.7)
Neutrophils Relative %: 67 %
Platelets: 289 10*3/uL (ref 150–400)
RBC: 4.64 MIL/uL (ref 3.87–5.11)
RDW: 11.6 % (ref 11.5–15.5)
WBC: 6.4 10*3/uL (ref 4.0–10.5)
nRBC: 0 % (ref 0.0–0.2)

## 2019-02-28 LAB — URINALYSIS, ROUTINE W REFLEX MICROSCOPIC
Bilirubin Urine: NEGATIVE
Glucose, UA: NEGATIVE mg/dL
Hgb urine dipstick: NEGATIVE
Ketones, ur: NEGATIVE mg/dL
Nitrite: NEGATIVE
Protein, ur: NEGATIVE mg/dL
Specific Gravity, Urine: 1.016 (ref 1.005–1.030)
pH: 7 (ref 5.0–8.0)

## 2019-02-28 LAB — COMPREHENSIVE METABOLIC PANEL
ALT: 14 U/L (ref 0–44)
AST: 18 U/L (ref 15–41)
Albumin: 4.1 g/dL (ref 3.5–5.0)
Alkaline Phosphatase: 69 U/L (ref 38–126)
Anion gap: 9 (ref 5–15)
BUN: 15 mg/dL (ref 6–20)
CO2: 21 mmol/L — ABNORMAL LOW (ref 22–32)
Calcium: 9 mg/dL (ref 8.9–10.3)
Chloride: 105 mmol/L (ref 98–111)
Creatinine, Ser: 0.73 mg/dL (ref 0.44–1.00)
GFR calc Af Amer: >60 mL/min (ref >60)
GFR calc non Af Amer: >60 mL/min (ref >60)
Glucose, Bld: 123 mg/dL — ABNORMAL HIGH (ref 70–99)
Potassium: 3.7 mmol/L (ref 3.5–5.1)
Sodium: 135 mmol/L (ref 135–145)
Total Bilirubin: 0.4 mg/dL (ref 0.3–1.2)
Total Protein: 7.8 g/dL (ref 6.5–8.1)

## 2019-02-28 NOTE — ED Notes (Signed)
Pt resting with lights dimmed until lunch tray arrived. Calm at this time. Per sitter pt bathed earlier.

## 2019-03-01 NOTE — Progress Notes (Signed)
CSW spoke with Tressa Busman, pt's [facility]) care coordinator [phone]). Ms. Owens Shark stated that there is an entire team of professionals at [facility] working every day to find pt placement outside of [facility] ED. They are looking all over the state and are focusing on ICFs (Intermediate Care Facilities) instead of group homes, as they provide more structure.   Disposition will continue to follow.  Audree Camel, LCSW, [place] Disposition [facility] BHH/TTS [phone]
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
## 2019-03-01 NOTE — Care Management (Signed)
  Under review [place], [facility], [place], [facility], [place] [facility], [facility], [facility], [facility], [facility], [facility], [facility], [facility], [facility], [place], [place], [place], [place], [facility], [facility], [facility], [facility], [facility], [facility], [facility], [facility], [facility], [facility], [facility] [place], [place], [place], [facility], [place], [place], [facility], [place]  

## 2019-03-01 NOTE — ED Notes (Addendum)
Pt diaper dry. Pt very quiet this morning . Not wanting to talk to nursing staff

## 2019-03-01 NOTE — BH Assessment (Signed)
[place] Assessment Progress Note   Patient continues to have volatile behaviors, requiring restraints and showing minimal improvement.  Patient continues to be in need of inpatient treatment.
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
## 2019-03-02 NOTE — BHH Counselor (Signed)
Patient was seen for re-assessment.  She presents as being in a pleasant mood having just eaten a meal.  She was non-verbal but in good mood.  Pt shook head no when asked if she felt SI or HI. Pt continues to be in need of inpatient treatment.
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
## 2019-03-02 NOTE — ED Notes (Signed)
Therapeutic music playing room. Patient smiling and dancing.

## 2019-03-02 NOTE — ED Notes (Signed)
Patient alert with music playing at this time.
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
## 2019-03-02 NOTE — ED Notes (Signed)
[facility] is looking at accepting pt this am.

## 2019-03-02 NOTE — ED Notes (Signed)
Patient given snacks to eat. Left wrist restraint removed from patient so patient could eat. Patient's brief changed/patient cleaned. Patient eating at this time.

## 2019-03-03 NOTE — ED Notes (Signed)
Pt content. listening to music.

## 2019-03-03 NOTE — ED Notes (Signed)
Left restraint removed, so patient can eat dinner.

## 2019-03-03 NOTE — BHH Counselor (Signed)
Reassessment- Pt was non-verbal. Pt only looked at this writer throughout the assessment. Pt could not answer any questions.  TTS continues to seek placement.  Lorenza [place], [facility] Triage Specialist
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
## 2019-03-03 NOTE — ED Notes (Signed)
Patient given meal tray, left arm restraint released so patient could eat, patient at 100% of tray, and pack of peanut butter crackers, patient continues to grab at staff, placed back in non-violent restraints bilaterally.

## 2019-03-03 NOTE — ED Notes (Signed)
Given meal tray.

## 2019-03-03 NOTE — Progress Notes (Addendum)
CSW spoke with Ina Kick with the [facility] yesterday and she recommended that pt be referred to the [facility] by her [facility]   Tressa Busman, IDD Care Navigator with [facility] contacted CSW today and shared that [facility] is interested in possibly providing residential services to pt. A meeting between the agencies is being arranged currently and CSW will attend virtually. Ms Owens Shark explained that this will be an initial meeting so that the [place] can obtain additional information before making a decision as to whether they can support pt. She also stated that pt is not eligable for the [facility] Program due to her age. The program is for individuals 5 through 17.   Audree Camel, LCSW, [place] Disposition [facility] BHH/TTS [phone]

## 2019-03-04 LAB — URINALYSIS, ROUTINE W REFLEX MICROSCOPIC
Bilirubin Urine: NEGATIVE
Glucose, UA: NEGATIVE mg/dL
Hgb urine dipstick: NEGATIVE
Ketones, ur: NEGATIVE mg/dL
Leukocytes,Ua: NEGATIVE
Nitrite: NEGATIVE
Protein, ur: NEGATIVE mg/dL
Specific Gravity, Urine: 1.011 (ref 1.005–1.030)
pH: 6 (ref 5.0–8.0)

## 2019-03-04 LAB — BASIC METABOLIC PANEL
Anion gap: 8 (ref 5–15)
BUN: 11 mg/dL (ref 6–20)
CO2: 21 mmol/L — ABNORMAL LOW (ref 22–32)
Calcium: 8.7 mg/dL — ABNORMAL LOW (ref 8.9–10.3)
Chloride: 107 mmol/L (ref 98–111)
Creatinine, Ser: 0.66 mg/dL (ref 0.44–1.00)
GFR calc Af Amer: >60 mL/min (ref >60)
GFR calc non Af Amer: >60 mL/min (ref >60)
Glucose, Bld: 90 mg/dL (ref 70–99)
Potassium: 3.7 mmol/L (ref 3.5–5.1)
Sodium: 136 mmol/L (ref 135–145)

## 2019-03-04 NOTE — ED Notes (Signed)
Pt vomited during breakfast.  Dr Sabra Heck in to assess pt.  See new orders.

## 2019-03-04 NOTE — ED Notes (Signed)
Pt will not allow Tech to obtain labs.  Several staff members in to assist.
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
## 2019-03-04 NOTE — ED Notes (Signed)
Attempted to change patients brief but patient became combative and I was unable to

## 2019-03-04 NOTE — BH Assessment (Signed)
Reassessment note: Pt presents sitting upright in bed dressed in hospital gown. Pt appears calm and steadily engaged with this Probation officer for assessment. Pt kept eyes on monitor, nodding head and making soft, pleasant, unintelligible sounds. When asked if pt had her Minnie mouse, she tapped it where it was tucked on the far side of her bed. When asked if she liked to rock her baby doll, pt smiled and moved her arms as if rocking a baby as this Probation officer demonstrated. As encouraged, pt then put her baby doll in her arms and rocked it. Pt continues to be under review at several facilities.

## 2019-03-04 NOTE — ED Notes (Addendum)
Dr Sabra Heck informed that this is 3rd attempt to draw am labs.  Nurse, 2 NTs and Tech from lab in to attempt blood draw.  Pt has not vomited since breakfast.  Has eaten meals without difficulty.

## 2019-03-04 NOTE — ED Notes (Signed)
patient ate bacon, drank milk and drank a soda. She tolerated it well

## 2019-03-04 NOTE — ED Notes (Signed)
Tech for lab failed on second attempt.

## 2019-03-05 LAB — URINE CULTURE: Culture: <10000 — AB

## 2019-03-05 NOTE — ED Notes (Signed)
Given meal tray.
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
## 2019-03-05 NOTE — BH Assessment (Signed)
[place] Assessment Progress Note   TTS spoke to patient's nurse to see what patient's behavior has been over the past 24 hours since patient is essentially non-verbal.  Patient had a good night and slept most of the night and continues to sleep this morning.  Nurse states that patient can be labile at times, especially if started.  [facility] and will be transported when a bed becomes available.
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
## 2019-03-05 NOTE — ED Notes (Signed)
Pt breakfast tray placed on bedside table. Pt resting at this time. 

## 2019-03-05 NOTE — ED Notes (Signed)
Pt eat a cup of orange jello and a cup of vanilla puddingand a cup of water

## 2019-03-06 NOTE — ED Notes (Signed)
Pt given lunch tray, bed linen changed by sitter, pt tolerated well, soft restraint remains in place to right wrist, cms intact,
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
## 2019-03-06 NOTE — ED Notes (Signed)
Pt cooperative at present, right wrist remains in loose soft restraints, cms intact.

## 2019-03-06 NOTE — ED Notes (Signed)
Pt cleaned, bed linen changed, pt cooperative, tolerated well, no aggression noted,

## 2019-03-06 NOTE — ED Notes (Signed)
Received report on pt, pt sitting in bed, rocking back and forth humming to music playing, minnie mouse stuffed animal in bed with pt, soft restraint on right wrist, cms intact, sitter remains at bedside,

## 2019-03-06 NOTE — ED Notes (Signed)
Dinner tray provided,
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
## 2019-03-06 NOTE — ED Notes (Signed)
Pt continues to dance in bed, sitter remains at bedside, cms remains intact to right wrist, soft restraint in place,

## 2019-03-06 NOTE — ED Notes (Signed)
Telepsych machine in room with patient. 

## 2019-03-07 NOTE — ED Notes (Signed)
Patient was soiled, changed her and placed a clean dry brief on her. I also checked her restraint on her right arm and the restraint is placed on properly. I am able to place 2 fingers in it and there is no skin discoloration

## 2019-03-07 NOTE — ED Notes (Signed)
Patient's brief was soiled with urine and she is on her cycle, Fred nt and myself changed patient.

## 2019-03-07 NOTE — ED Notes (Signed)
Patient had a soiled brief and she had diarrhea, notified nurse lori and placed a clean dry brief on her. Patient was not combative.

## 2019-03-07 NOTE — Progress Notes (Signed)
CSW spoke with Tressa Busman, IDD Care Navigator with [facility]. There will be a meeting on 11.4.20 at [place] with [facility] and [facility] about possible placement for pt. CSW will be in attendance.   Audree Camel, LCSW, [place] Disposition [facility] BHH/TTS [phone]

## 2019-03-07 NOTE — ED Notes (Signed)
Gave patient water to finish meal with.

## 2019-03-08 NOTE — BH Assessment (Signed)
Reassessment note: Pt presents sitting up in bed. She appears calm. Pt makes no verbal sounds. She makes brief eye contact then continues to look about room and back to tv. Pt smiled & made "bye-bye" motion with her hand at close of assessment. She continues to meet criteria for inpt tx

## 2019-03-08 NOTE — ED Notes (Signed)
Pt given meal tray.

## 2019-03-08 NOTE — ED Notes (Signed)
Changed pt wash her off changed bed gave her 2 puddings and a cup of water

## 2019-03-08 NOTE — ED Notes (Signed)
Patient was soiled, changed her into a clean dry brief and gave her a meal tray

## 2019-03-09 NOTE — ED Notes (Signed)
Restraint changed to L wrist, skin condition checked, right wrist free of breakdown or redness. Pt resting at this time.

## 2019-03-09 NOTE — ED Notes (Signed)
Pt had a BM. [name] bottom and sheets were cleaned.

## 2019-03-09 NOTE — BH Assessment (Signed)
Reassessment Note: Pt presents sitting upright in hospital bed. She is active and verbal this morning. Pt smiling, clapping & dancing with arms and upper body. Pt copied thumbs up and wave goodbye. She continues to be recommended for inpt psychiatric tx.

## 2019-03-09 NOTE — Progress Notes (Signed)
CSW faxed nursing notes to Tressa Busman, I/DD Coordinator at [employer] [employer], at her request.   CSW and Jolene Provost, RN, [facility] supervisor attended a virtual meeting this morning held by [facility] (pt's LME) and [facility] about possible placement options. Unfortunately, [facility] will not be able to make a bed offer at this time as pt will need a private room and [facility] only has a bed for a consumer who can tolerate a roommate.   CSW will continue to follow.   Audree Camel, LCSW, [place] Disposition [facility] BHH/TTS [phone]

## 2019-03-09 NOTE — ED Notes (Signed)
[name] bottom was cleaned and sheets were changed.

## 2019-03-10 NOTE — ED Notes (Signed)
This RN & NT did a full linen change. Skin assessment done, no breakdown noted at this time.

## 2019-03-10 NOTE — ED Provider Notes (Signed)
Patient is pending psychiatric placement.  Currently under IVC.  No acute complaints of nursing staff this morning.  Patient's vitals remained within normal limits on most recent check this morning.   Wyvonnia Dusky, MD 03/10/19 [phone]
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
## 2019-03-11 NOTE — ED Notes (Signed)
TTS in progress 

## 2019-03-11 NOTE — ED Notes (Signed)
Pt was given breakfast tray. Pt ate all of breakfast.

## 2019-03-12 NOTE — ED Notes (Signed)
Placed dry clean brief on pt and pt gave herself a bed bath, notifed nurse

## 2019-03-12 NOTE — ED Notes (Signed)
Patient washed her face and her hands after eating breakfast.

## 2019-03-12 NOTE — BH Assessment (Signed)
[place] Assessment Progress Note  Patient's chart reviewed and it appears that she continues to act out aggressively with the nursing staff when they are trying to work with her.  TTS spoke to patient's nurse, [place], who states that patient had to be placed in 4 point restraints last pm due to her behavior, but now she is back down to one and is relative cooperative.  Due to her continued volatile behavior, continued inpatient is recommended.

## 2019-03-12 NOTE — ED Provider Notes (Signed)
Blood pressure (!) 96/56, pulse 77, temperature 98.2 F (36.8 C), resp. rate 18, height 5\' 5"  (1.651 m), weight 59 kg, last menstrual period 02/12/2019, SpO2 100 %.  In short, Meagan Rivera is a 18 y.o. female with a chief complaint of V70.1 .  Refer to the original H&P for additional details.  07:34 AM  Patient continuing to await psychiatric placement.  No issues overnight.  I have extended the patient's IVC.     Margette Fast, MD 03/12/19 [phone]

## 2019-03-12 NOTE — ED Notes (Signed)
Patient was soiled with urine, changed and placed a clean dry brief on here and also gave her a ginerale

## 2019-03-13 NOTE — BH Assessment (Signed)
Clinician contacted pt's nurse and sitter to determine if pt continues to meet inpatient criteria. [name] nurse and sitter state pt has one restraint on her left wrist. They share she has had no behavioral problems today. They share she ate well today and cleaned her plate. They state that, as far as they have been able to tell, she has been sleeping well. Overall, pt appeared well-groomed, was bright-eyed, and, though she did not-respond to clinician's questions (non-verbal autism), she did look at clinician on the Tele-Assessment machine when clinician was talking to her.

## 2019-03-13 NOTE — ED Notes (Signed)
TTS done 

## 2019-03-13 NOTE — ED Notes (Signed)
Meal was given to patient, she tolerated it well

## 2019-03-13 NOTE — ED Notes (Signed)
Peri-care performed

## 2019-03-13 NOTE — ED Notes (Signed)
Patient was soiled so I placed a dry clean brief on her. Patient is calm and corporative and gave herself a bed bath. Patient does have a very small spot on her inner right thigh that has some skin breakdown. Notified nurse Daphine

## 2019-03-13 NOTE — ED Notes (Signed)
Pt. Has been calm and cooperative.

## 2019-03-14 NOTE — ED Notes (Signed)
Pt

## 2019-03-14 NOTE — ED Notes (Signed)
Pt wet,cleaned up and new brief on,tolerated care well

## 2019-03-14 NOTE — BH Assessment (Signed)
Reassessment note: Pt presents sitting upright in bed. She immediately waved her fingers when monitor connected. When asked where Jeannett Senior mouse was, pt pulled back covers to reveal Jeannett Senior and her baby doll. Pt picked up doll and rocked it, as she has each time during my reassessments with her. [name] mood appears bright and calm at this time.

## 2019-03-14 NOTE — ED Provider Notes (Signed)
Patient's restraint orders were renewed.  She has been sleeping throughout the night.  She is currently sound asleep.   Rolland Porter, MD 03/14/19 [phone]
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
## 2019-03-14 NOTE — ED Notes (Signed)
Pt was wet,cleaned up and new brief,pt resting,tolerated care well.
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
## 2019-03-15 NOTE — Progress Notes (Signed)
CSW received call back from from Admissions Director Sunday Corn from [facility]. Ms Amedeo Plenty explains that patient continues to be on waiting list and is unsure as to how long the length of wait could be.   TOC team will continue to follow patient for any discharge related needs.   [place] Transitions of Care  Clinical Social Worker  Ph: [phone]

## 2019-03-15 NOTE — ED Notes (Signed)
Pt had bowel movement all over her,pt given a complete bed bath from head to toe,sheets changed,clean gown and brief on.Pt tolerated care well.
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
## 2019-03-16 NOTE — Progress Notes (Signed)
CSW spoke with Ina Kick with the [facility] [phone]) who requesting updates on pt's treatment, including medications given, the need for physical/chemical restraints, and pt's overall behavior in the ED. She will meet with her supervisor later this week to discuss other possible placement options.   Disposition will continue to follow.   Audree Camel, LCSW, [place] Disposition [facility] BHH/TTS [phone]

## 2019-03-16 NOTE — ED Notes (Signed)
Gave patient crackers and milk

## 2019-03-16 NOTE — ED Notes (Addendum)
Pt dried and washed: bed linens changed

## 2019-03-16 NOTE — BH Assessment (Signed)
Reassessment note: Pt presents sitting upright in bed, with right wrist tethered to bed. She is bright & smiling. Pt making eye contact and engaging in interaction through monitor. She also interacts with staff at her side, following staff directions like wave hello. Pt continues to be on [place] wait list.

## 2019-03-17 NOTE — ED Notes (Signed)
Pt feed apple sauce and pudding

## 2019-03-17 NOTE — ED Notes (Signed)
Linzess not available in pyxis. Message sent to pharmacy.

## 2019-03-17 NOTE — ED Notes (Signed)
Patient made and received a phone call, she is calm

## 2019-03-17 NOTE — BHH Counselor (Signed)
  REASSESSMENT  [place] reevaluated pt for safety and stability.  Pt was observed sitting up in the bed smiling and holding her dolls.  Pt is nonverbal but was able to respond to [facility] by nodding her head.  Pt nodded yes to the following questions:  Did you eat your breakfast this morning? Do you feel okay? Are you watching cartoons?  Are you playing with your dolls?  Pt was able to count the number dolls with [facility].  Pt held up 2 fingers after she finished counting the dolls. CSW will continue to look for placement for patient.  Brazil Voytko L. [place], [place], [facility], [facility] Therapeutic Triage Specialist  [phone]

## 2019-03-17 NOTE — ED Notes (Signed)
Dr. Roderic Palau laid eyes on patient today and verbally reordered non-violent restraint.

## 2019-03-17 NOTE — ED Provider Notes (Signed)
Patient's wrist soft restraints patient soft restraints renewed.  This is appropriate for patient.  Patient seen by me.   Fredia Sorrow, MD 03/17/19 [phone]

## 2019-03-17 NOTE — ED Notes (Signed)
Pt feed apple sause

## 2019-03-18 ENCOUNTER — Encounter (HOSPITAL_COMMUNITY): Payer: Self-pay | Admitting: Emergency Medicine

## 2019-03-18 NOTE — ED Notes (Signed)
Pt restraint has been released  She has played catch with her ball and this RN- laughing as she "heads" the ball in attempt to return it to nurse   Signals hand to mouth while saying eat  Crackers and pos offered until lunch here

## 2019-03-18 NOTE — BHH Counselor (Signed)
TTS completed reassessment with patient  Pt was observed sitting up in the bed smiling and holding her dolls.  Pt is nonverbal but was able to respond to by nodding her head. Pt nodded head no when asked about SI, HI. Pt nodded head and smiled when asked about watching cartoons and having a good day. Pt right wrist still in restraint. Pt in joyful and playful mood and had 2 dolls laying beside her. Pt continues to be on [place] wait list.

## 2019-03-18 NOTE — ED Notes (Signed)
Restraint remains off   Pt has enjoyed her lunch   Currently watching TV

## 2019-03-18 NOTE — ED Notes (Signed)
Per TTS  Pt continues to await a room at [facility] They are aware of her length of stay in  The ED

## 2019-03-18 NOTE — ED Notes (Signed)
Shouting with increased agitation  PRN ativan given to calm and keep from restraining   Pt willingly took med

## 2019-03-18 NOTE — ED Notes (Signed)
Pt reports hunger   With hands to mouth and reports "eat"  Nabs provided, as well as applesauce

## 2019-03-18 NOTE — ED Notes (Signed)
Pt being changed   After changing restraint is to be removed and pt is to be allowed ROM, to toss ball with sitter and freedom of movement

## 2019-03-18 NOTE — ED Notes (Signed)
Meal provided 

## 2019-03-18 NOTE — ED Notes (Signed)
[name] bottom was cleaned and sheets were changed. 

## 2019-03-18 NOTE — ED Notes (Signed)
Played catch and danced with pt   She remains unrestrained and is alert and calm
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
## 2019-03-18 NOTE — ED Notes (Signed)
Pt continues to dance in bed with   Music therapy

## 2019-03-19 NOTE — ED Notes (Signed)
Pt has been unrestrained all day

## 2019-03-19 NOTE — ED Notes (Signed)
Patient interactive with staff, reading books.

## 2019-03-19 NOTE — BHH Counselor (Signed)
Re-assessment:   TTS conversed with Alyse Low, RN, for an update on the patient's status. Report patient's behaviors has not changed. Patient continues to non-verbal and nurse report patient is sitting in her bed. Pt continues to meet inpt criteria. Awaiting placement at [facility].

## 2019-03-20 NOTE — ED Notes (Signed)
Pt is a bit agitated with loud vocalization and furrowed brow   She is given a mango pushup which she likes and is currently eating   She is also given PRN lorazepam for her agitation

## 2019-03-20 NOTE — BHH Counselor (Signed)
  REASSESSMENT  [place] reevaluated pt for safety and stability. ([name] arms were not observed to be restrained during the assessment).  Pt was observed sitting up in the bed smiling, watching cartoons, and holding her doll.  Pt is nonverbal but was able to respond to [facility] by nodding her head.  Pt nodded yes to the following questions:  Did you eat your breakfast this morning? Do you feel okay? Are you watching cartoons?  Are you playing with your dolls? Pt hugged her doll and gave it a kiss then laid beside her. Pt was able to count the number dolls with [facility].  Pt held up 2 fingers after she finished counting the dolls. CSW will continue to look for placement for patient.  Asriel Westrup L. [place], [place], [facility], [facility] Therapeutic Triage Specialist  [phone]

## 2019-03-20 NOTE — ED Notes (Signed)
Pt with large soft BM  Cleaned and given lotion to put on her legs and handds

## 2019-03-21 NOTE — Progress Notes (Signed)
CSW left message with [facility] [phone]) requesting a return call to discuss possible placement assistance.   Audree Camel, LCSW, [place] Disposition [facility] BHH/TTS [phone]

## 2019-03-21 NOTE — ED Notes (Signed)
Patient had a bowel movent, while changing her I noticed some skin break down and patient screamed of pain. Notified nurse meagan and placed some barrier oinment on her buttom and placed a dry clean brief on her

## 2019-03-21 NOTE — BH Assessment (Signed)
Pt presents with bright facial expression. She is sitting upright in bed, dancing with her arms and upper body at times. Pt showed her baby doll, minnie mouse and new little santa that she kept in her hand entire time. Continue to recommend inpt tx.

## 2019-03-22 NOTE — ED Notes (Signed)
Patient resting in bed at this time. Sitter at bedside. Patient is alert and oriented to baseline. No needs at this time. Will continue to monitor.

## 2019-03-22 NOTE — BHH Counselor (Signed)
Patient contact [facility] ED to reassess patient. Per RN patient is currently sleeping. This counselor will assess when alert.

## 2019-03-23 NOTE — ED Notes (Signed)
Patient was very soiled. Changed her sheets she gave herself a bed bath and I  placed barrier cream on the patients bottom, notified charge nurse.

## 2019-03-23 NOTE — ED Notes (Signed)
No redness or breakdown noted to sacral area

## 2019-03-23 NOTE — ED Notes (Signed)
Patient is calm watching tv and eating crackers

## 2019-03-23 NOTE — ED Notes (Signed)
Patient ate breakfast tray and vomited.. I asked her if her tummy hurts or if she didn't feel good she shook her head no and I asked if she just ate to fast or to much and she shook her head yes. Changed patient, she is currently watching tv. vitials are all within normal range
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
## 2019-03-24 NOTE — Progress Notes (Signed)
CSW received phone call from Ina Kick, with[facility] [phone]), who stated that after discussing case with her supervisor, they have reached out to [facility] who have agreed to accept pt. A new diversion exception and state hospital referral will be completed today and sent to [facility]) for authorization.   Audree Camel, LCSW, [place] Disposition [facility] BHH/TTS [phone]

## 2019-03-24 NOTE — ED Notes (Signed)
Toileted peri care provided,clean brief put on.

## 2019-03-24 NOTE — ED Notes (Signed)
Pt did hand sign "sorry " to Aide.
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
## 2019-03-24 NOTE — ED Notes (Signed)
Security at bedside

## 2019-03-24 NOTE — BH Assessment (Addendum)
Reassessment: Pt was non-communicative. Pt was alert watching TV. Pt continues to meet inpatient criteria.

## 2019-03-24 NOTE — ED Notes (Signed)
Television turned off and lights out with door opened.

## 2019-03-24 NOTE — ED Notes (Signed)
Pt not in restraints
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
## 2019-03-24 NOTE — ED Notes (Signed)
Peri-care and linen change done.

## 2019-03-24 NOTE — ED Notes (Signed)
Pt is cooperative.

## 2019-03-25 NOTE — BH Specialist Note (Signed)
Reassessment: Pt was non-communicative. Pt was alert watching TV and drinking hot choclate. Pt continues to meet inpatient criteria.

## 2019-03-25 NOTE — ED Notes (Signed)
Mary from [facility] was faxed the patient's H&P along with all lab values. The patient is still under a review for bed placement in this facility.

## 2019-03-25 NOTE — ED Notes (Signed)
Peri-care performed

## 2019-03-25 NOTE — ED Notes (Signed)
Pt was changed and repositioned

## 2019-03-25 NOTE — ED Notes (Signed)
Renee, NT offered to give pt a bath. Pt refused

## 2019-03-25 NOTE — Care Management (Signed)
Writer spoke to [place] at [facility] and confirmed that the referral has been received.

## 2019-03-25 NOTE — Progress Notes (Signed)
Authorization from [place] for state hospital placement: [id number] [facility]   [place] Disposition [facility] BHH/TTS [phone]

## 2019-03-25 NOTE — ED Notes (Signed)
Upon on coming shift assessment pt does not need soft wrist restraints at this time.

## 2019-03-26 NOTE — ED Notes (Signed)
Renewed IVC papers faxed to [facility] after being served.

## 2019-03-26 NOTE — ED Notes (Signed)
Patient was soiled, changed her and placed a clean dry brief on her. She also vomited while eating crackers. I asked her if her tummy hurt or if she didn't feel good. She shook her head no. I asked her if she is feeling ok and just ate to fast, Patient shook her head yes, then asked to eat. I told her breakfast will be here shortly

## 2019-03-26 NOTE — ED Notes (Signed)
Changed patient even though she was dry and gave her a soda \\to  drink

## 2019-03-26 NOTE — ED Notes (Signed)
IVC papers renewed and faxed to the [facility].  Waiting to be served.  Will fax a copy to [facility] once served.

## 2019-03-26 NOTE — ED Provider Notes (Signed)
Vitals:   03/25/19 1818 03/26/19 0609  BP: 133/72 114/70  Pulse: (!) 104 85  Resp: 18 16  Temp: 98.8 F (37.1 C) 98.4 F (36.9 C)  SpO2: 100% 100%   Medically stable.  Pending psychiatric placement.  Continue to monitor.    Dorie Rank, MD 03/26/19 [phone]

## 2019-03-27 NOTE — ED Notes (Signed)
Pt was cleaned, pericare performed, dry brief provided and sheets changed. Pt calm and cooperative with care.

## 2019-03-27 NOTE — ED Notes (Signed)
Pt was given breakfast tray 

## 2019-03-27 NOTE — ED Notes (Signed)
[name] brief was changed and bottom cleaned. Sheets were changed.

## 2019-03-27 NOTE — ED Notes (Signed)
[name] bottom was cleaned and brief was changed.

## 2019-03-27 NOTE — ED Provider Notes (Signed)
Vitals:   03/26/19 1819 03/27/19 0645  BP: 133/77 135/75  Pulse: (!) 102 100  Resp: 18 18  Temp: 98.5 F (36.9 C)   SpO2: 100% 100%   Medically stable.  Holding in the ED for psychiatric placement.   Dorie Rank, MD 03/27/19 [phone]

## 2019-03-27 NOTE — ED Notes (Signed)
Mother visiting with patient at this time 

## 2019-03-27 NOTE — BHH Counselor (Signed)
Reassessment- Per RN the Pt has been stable. The [name] bio mom stated she will come and see the Pt today for her birthday.   Pt remains on [facility] waitlist.  Lorenza [place], [facility] Triage Specialist

## 2019-03-27 NOTE — Progress Notes (Signed)
CSW spoke with Pamala Hurry with admitting at [facility] who stated that pt was being reviewed for admissions by the admissions manager due to pt's IQ. At this time they could not provide an answer regarding potential admission.   Darletta Moll MSW, [place] Worker Disposition  [facility] Ph: [phone] Fax: [phone]

## 2019-03-28 DIAGNOSIS — R479 Unspecified speech disturbances: Secondary | ICD-10-CM

## 2019-03-28 DIAGNOSIS — R456 Violent behavior: Secondary | ICD-10-CM

## 2019-03-28 DIAGNOSIS — F840 Autistic disorder: Secondary | ICD-10-CM

## 2019-03-28 LAB — SARS CORONAVIRUS 2 BY RT PCR (HOSPITAL ORDER, PERFORMED IN ~~LOC~~ HOSPITAL LAB): SARS Coronavirus 2: NEGATIVE

## 2019-03-28 NOTE — ED Notes (Signed)
Attempt call to caretaker to update status.

## 2019-03-28 NOTE — ED Notes (Signed)
Patient eating at this time.

## 2019-03-28 NOTE — Progress Notes (Addendum)
CSW spoke with [place] in admissions at [facility]. Pt is being accepted to [facility] tomorrow. For admission, they need the following items: new IVC, new Covid test, new Diversion Exception with authorization from [place], most recent nursing notes, vitals, and MAR. [facility] RN and [facility] supervisor Jolene Provost have been notified. CSW will continue to follow and provide requested documentation to [facility].   Audree Camel, LCSW, [place] Disposition CSW [facility] BHH/TTS [phone]   UPDATE 1124am: Amedeo Plenty at [facility] states she does not need an updated Diversion Exception or IVC as they were both completed on 03/26/19. Michelle @[facility]  notified. She will fax the most recent completed IVC to [facility] at [phone]
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
## 2019-03-28 NOTE — [facility] Counselor (Signed)
TTS reassessment: Patient is nonverbal. She is sitting upright in bed and appears euthymic. She is smiling and watching her TV show. I asked if she was feeling okay and she gave a thumbs up and a smile. I asked if she had slept the previous night and she shook her head "no." Patient showed this counselor several of her toys. Patient continues to be awaiting placement at [facility].

## 2019-03-28 NOTE — ED Notes (Signed)
Checked patients brief she was dry and clean
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
## 2019-03-28 NOTE — BH Assessment (Addendum)
Spoke with [facility] in admissions at [facility]. Pt is being accepted to [facility] tomorrow, 03/28/2019. She requested doctors notes. Counselor faxed those notes as request. She stated that their was no accepting doctors name to provide at this time. She instructed that nursing staff at [facility] call report in the morning [phone] to discuss further patient's acceptance details.

## 2019-03-28 NOTE — BH Assessment (Signed)
Mary called from [facility] and stated that their need to be an updated note/assessment from an MD or NP regarding patient's current status. Counselor discussed with Corene Cornea, NP and he has agreed to assess patient. Once his assessment is complete TTS staff will fax note to [facility].

## 2019-03-29 MED ORDER — STERILE WATER FOR INJECTION IJ SOLN
INTRAMUSCULAR | Status: AC
  Filled 2019-03-29: qty 10

## 2019-03-29 MED ORDER — LORAZEPAM 2 MG/ML IJ SOLN
2.0000 mg | Freq: Once | INTRAMUSCULAR | Status: AC
  Administered 2019-03-29: 12:00:00 2 mg via INTRAMUSCULAR
  Filled 2019-03-29: qty 1

## 2019-03-29 MED ORDER — DOUBLE ANTIBIOTIC 500-10000 UNIT/GM EX OINT
TOPICAL_OINTMENT | Freq: Once | CUTANEOUS | Status: DC
  Filled 2019-03-29: qty 1

## 2019-03-29 MED ORDER — ZIPRASIDONE MESYLATE 20 MG IM SOLR
20.0000 mg | Freq: Once | INTRAMUSCULAR | Status: AC
  Administered 2019-03-29: 10:00:00 20 mg via INTRAMUSCULAR
  Filled 2019-03-29: qty 20
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
## 2019-03-29 NOTE — BH Assessment (Signed)
NP Assessment/Progress note from 03/28/2019 was faxed to [facility] at [phone]
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
## 2019-03-29 NOTE — ED Notes (Signed)
Patient to go to [facility] after 08:30 per [facility] at [place] is to call at 08:30am [phone] wanted patient to be re-assessed by Nurse Practionier to update patient patient progress notes and to reassess patient. Patient was asleep when contacted by [facility] through tele-monitor.
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
## 2019-03-29 NOTE — ED Notes (Signed)
[facility] sending transport to transfer pt to [facility].

## 2019-03-29 NOTE — ED Notes (Signed)
[facility] [employer] called to send [place] department to transport patient to [facility].

## 2020-12-19 IMAGING — CR DG CHEST 1V PORT
1 series · 1 of 1 positions shown · non-contrast
Comparison: None.

CLINICAL DATA: Altered mental status.

EXAM:
PORTABLE CHEST 1 VIEW

[portable]
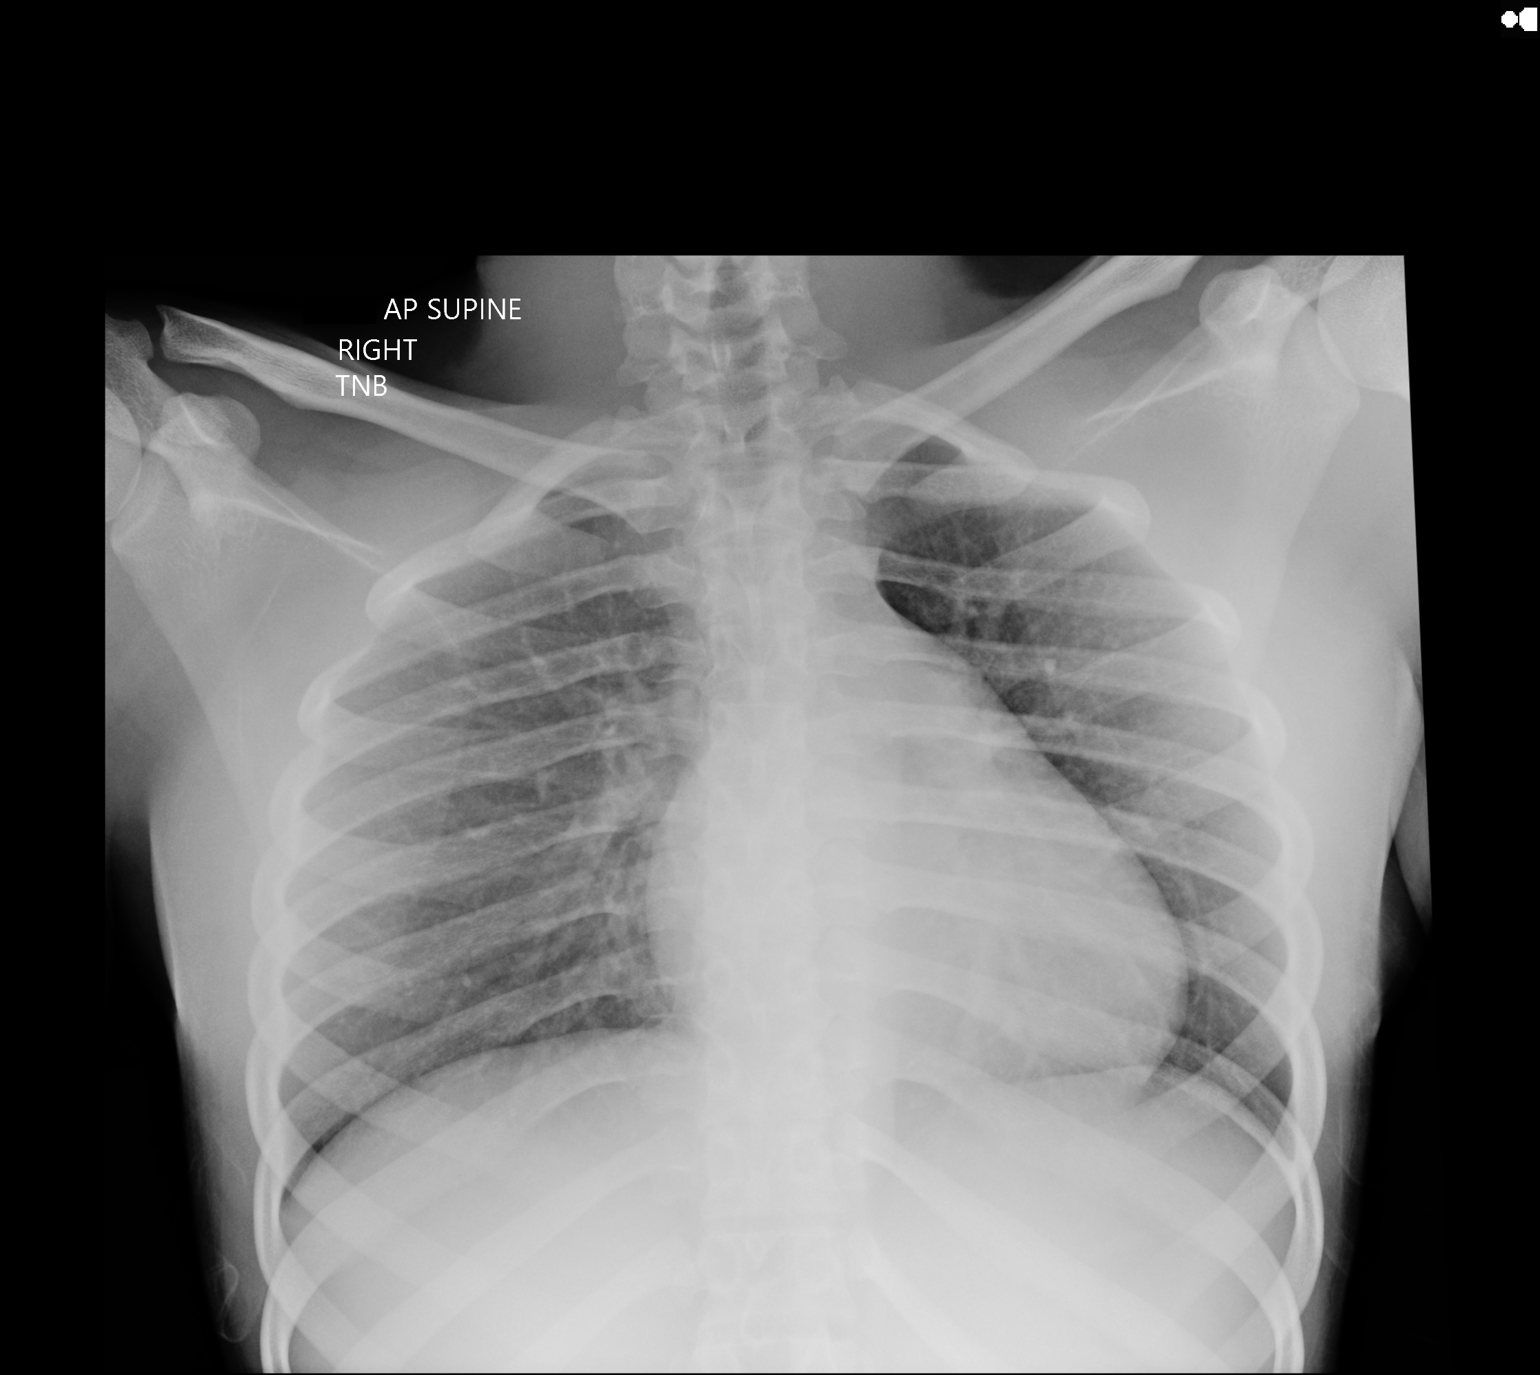

[1 of 1 positions shown; findings below may reference images not displayed]

FINDINGS: The heart size and mediastinal contours are within normal limits.
Both lungs are clear. The visualized skeletal structures are
unremarkable.
IMPRESSION: No active disease.

## 2021-01-05 IMAGING — DX DG CHEST 1V PORT
1 series · 1 of 1 positions shown · non-contrast
Comparison: Radiograph 02/11/2019

CLINICAL DATA: Question pneumonia

EXAM:
PORTABLE CHEST 1 VIEW

[chest ap]
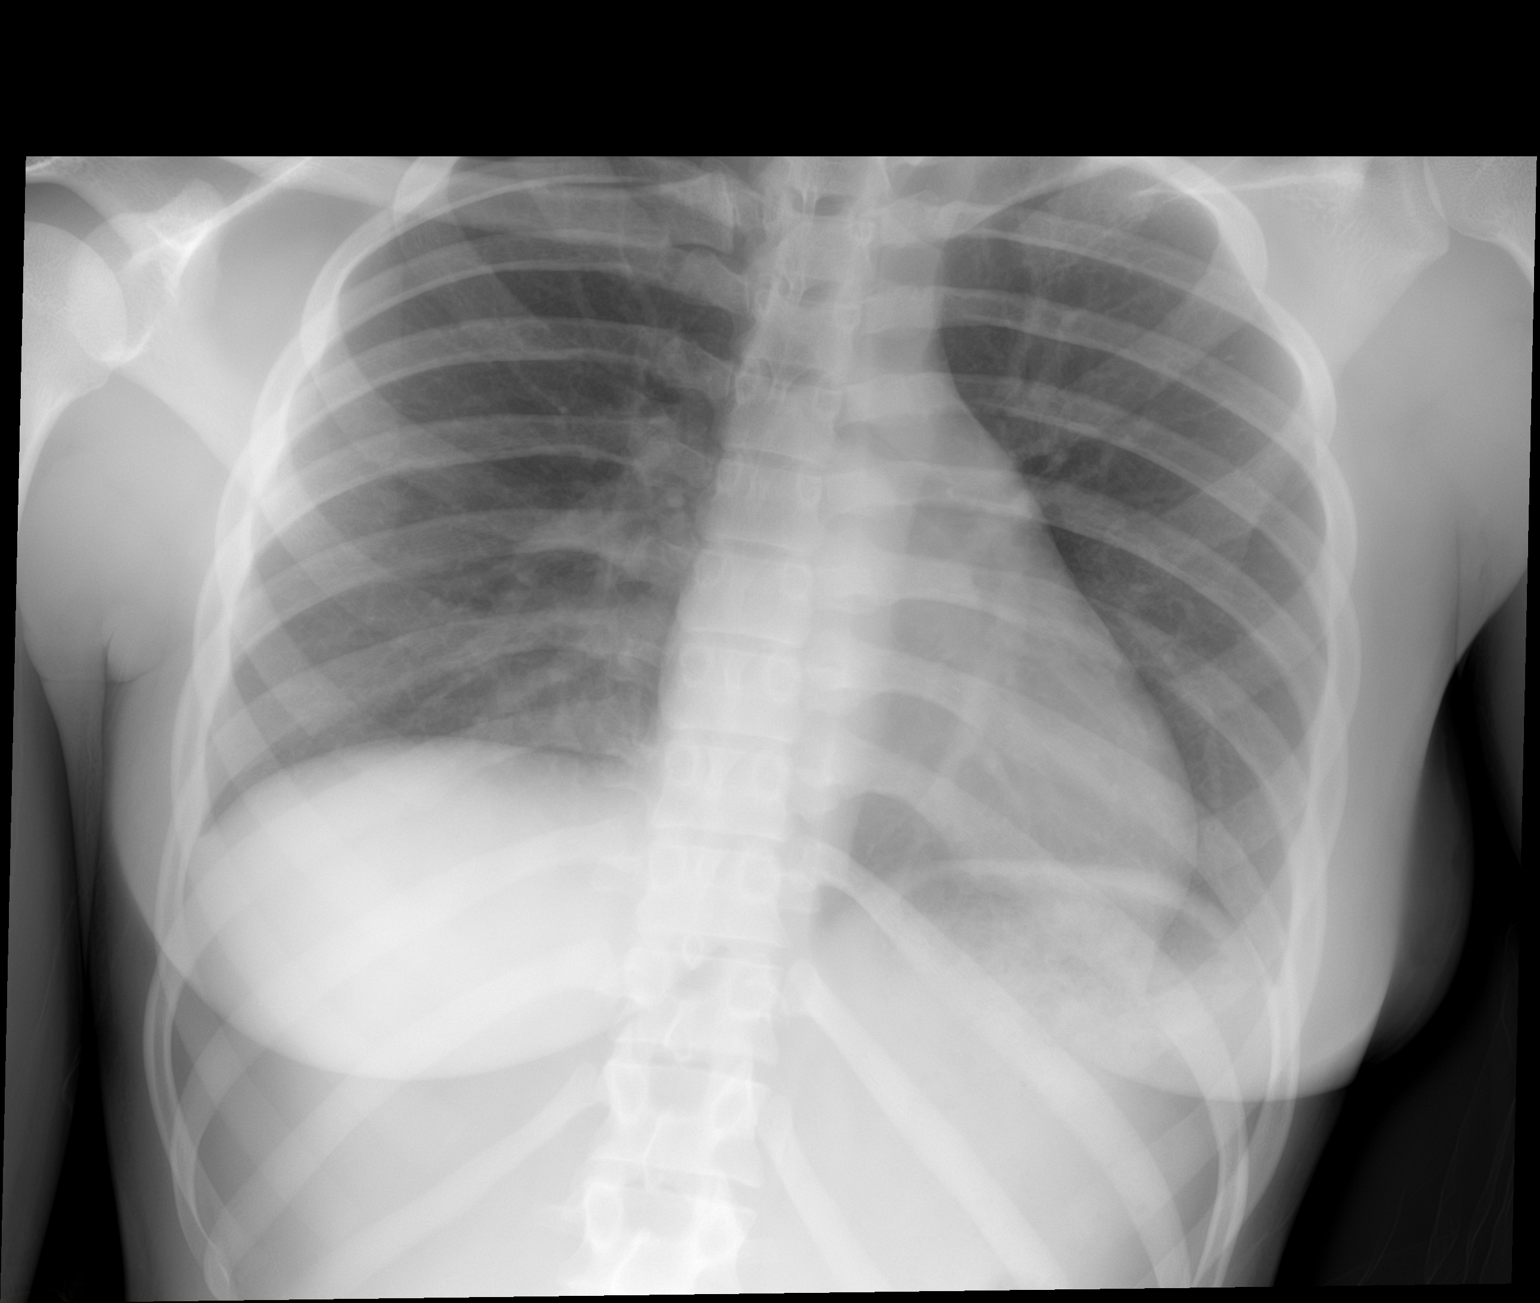

[1 of 1 positions shown; findings below may reference images not displayed]

FINDINGS: No consolidation, features of edema, pneumothorax, or effusion.
Pulmonary vascularity is normally distributed. The cardiomediastinal
contours are unremarkable. No acute osseous or soft tissue
abnormality.
IMPRESSION: No acute cardiopulmonary abnormality.
